# Patient Record
Sex: Female | Born: 1965 | Race: Black or African American | Hispanic: No | Marital: Single | Smoking: Never smoker
Health system: Southern US, Community
[De-identification: ages and names within clinical notes are randomized; demographics above are authoritative.]

## PROBLEM LIST (undated history)

## (undated) DIAGNOSIS — R7303 Prediabetes: Secondary | ICD-10-CM

## (undated) DIAGNOSIS — I10 Essential (primary) hypertension: Secondary | ICD-10-CM

## (undated) DIAGNOSIS — E119 Type 2 diabetes mellitus without complications: Secondary | ICD-10-CM

## (undated) HISTORY — PX: TUBAL LIGATION: SHX77

---

## 2007-04-18 ENCOUNTER — Ambulatory Visit: Payer: Self-pay

## 2014-09-03 ENCOUNTER — Ambulatory Visit
Admit: 2014-09-03 | Disposition: A | Discharge status: Home / Self Care | Payer: Self-pay | Attending: Urgent Care | Admitting: Urgent Care

## 2015-09-07 ENCOUNTER — Encounter: Payer: Self-pay | Admitting: *Deleted

## 2015-09-07 ENCOUNTER — Ambulatory Visit: Payer: Self-pay | Attending: Oncology | Admitting: *Deleted

## 2015-09-07 ENCOUNTER — Ambulatory Visit
Admission: RE | Admit: 2015-09-07 | Discharge: 2015-09-07 | Disposition: A | Discharge status: Home / Self Care | Payer: Self-pay | Source: Ambulatory Visit | Attending: Oncology | Admitting: Oncology

## 2015-09-07 ENCOUNTER — Other Ambulatory Visit: Payer: Self-pay | Admitting: *Deleted

## 2015-09-07 VITALS — BP 158/103 | HR 87 | Temp 98.2°F | Ht 66.93 in | Wt 352.1 lb

## 2015-09-07 DIAGNOSIS — Z0000 Encounter for general adult medical examination without abnormal findings: Secondary | ICD-10-CM

## 2015-09-07 NOTE — Progress Notes (Signed)
Subjective:     Patient ID: Candice Murillo, female   DOB: 02-04-1966, 50 y.o.   MRN: [id number]  HPI   Review of Systems     Objective:   Physical Exam  Pulmonary/Chest: Right breast exhibits inverted nipple. Right breast exhibits no mass, no nipple discharge, no skin change and no tenderness. Left breast exhibits inverted nipple. Left breast exhibits no mass, no nipple discharge, no skin change and no tenderness. Breasts are symmetrical.  Large pendulous breast.  Bilateral nipples inverted.  Patient states this is normal for her.       Assessment:     50 year old Black [facility]  Clinical breast exam unremarkable.  Taught self breast awareness.  Blood pressure elevated at 158/103 .  She is to recheck her blood pressure at [facility] or [facility], and if remains higher than 140/90 she is to follow-up with her primary care provider.  Hand out on hypertention given to patient.     Plan:     Joellyn Quailshristy Burton scheduled patient to be [facility] at 2:00 to evaluate the patients elevated BP.  She is agreeable.  Screening mammogram ordered.  Will follow-up per protocol.

## 2015-09-07 NOTE — Patient Instructions (Signed)
Patient has been screened for eligibility.  She does not have any insurance, Medicare or Medicaid.  She also meets financial eligibility.  Hand-out given on the Affordable Care Act.  Hypertension Hypertension, commonly called high blood pressure, is when the force of blood pumping through your arteries is too strong. Your arteries are the blood vessels that carry blood from your heart throughout your body. A blood pressure reading consists of a higher number over a lower number, such as 110/72. The higher number (systolic) is the pressure inside your arteries when your heart pumps. The lower number (diastolic) is the pressure inside your arteries when your heart relaxes. Ideally you want your blood pressure below 120/80. Hypertension forces your heart to work harder to pump blood. Your arteries may become narrow or stiff. Having untreated or uncontrolled hypertension can cause heart attack, stroke, kidney disease, and other problems. RISK FACTORS Some risk factors for high blood pressure are controllable. Others are not.  Risk factors you cannot control include:   Race. You may be at higher risk if you are African American.  Age. Risk increases with age.  Gender. Men are at higher risk than women before age 49 years. After age 49, women are at higher risk than men. Risk factors you can control include:  Not getting enough exercise or physical activity.  Being overweight.  Getting too much fat, sugar, calories, or salt in your diet.  Drinking too much alcohol. SIGNS AND SYMPTOMS Hypertension does not usually cause signs or symptoms. Extremely high blood pressure (hypertensive crisis) may cause headache, anxiety, shortness of breath, and nosebleed. DIAGNOSIS To check if you have hypertension, your health care provider will measure your blood pressure while you are seated, with your arm held at the level of your heart. It should be measured at least twice using the same arm. Certain conditions  can cause a difference in blood pressure between your right and left arms. A blood pressure reading that is higher than normal on one occasion does not mean that you need treatment. If it is not clear whether you have high blood pressure, you may be asked to return on a different day to have your blood pressure checked again. Or, you may be asked to monitor your blood pressure at home for 1 or more weeks. TREATMENT Treating high blood pressure includes making lifestyle changes and possibly taking medicine. Living a healthy lifestyle can help lower high blood pressure. You may need to change some of your habits. Lifestyle changes may include:  Following the DASH diet. This diet is high in fruits, vegetables, and whole grains. It is low in salt, red meat, and added sugars.  Keep your sodium intake below 2,300 mg per day.  Getting at least 30-45 minutes of aerobic exercise at least 4 times per week.  Losing weight if necessary.  Not smoking.  Limiting alcoholic beverages.  Learning ways to reduce stress. Your health care provider may prescribe medicine if lifestyle changes are not enough to get your blood pressure under control, and if one of the following is true:  You are 818-49 years of age and your systolic blood pressure is above 140.  You are 49 years of age or older, and your systolic blood pressure is above 150.  Your diastolic blood pressure is above 90.  You have diabetes, and your systolic blood pressure is over 140 or your diastolic blood pressure is over 90.  You have kidney disease and your blood pressure is above 140/90.  You have heart disease and your blood pressure is above 140/90. Your personal target blood pressure may vary depending on your medical conditions, your age, and other factors. HOME CARE INSTRUCTIONS  Have your blood pressure rechecked as directed by your health care provider.   Take medicines only as directed by your health care provider. Follow the  directions carefully. Blood pressure medicines must be taken as prescribed. The medicine does not work as well when you skip doses. Skipping doses also puts you at risk for problems.  Do not smoke.   Monitor your blood pressure at home as directed by your health care provider. SEEK MEDICAL CARE IF:   You think you are having a reaction to medicines taken.  You have recurrent headaches or feel dizzy.  You have swelling in your ankles.  You have trouble with your vision. SEEK IMMEDIATE MEDICAL CARE IF:  You develop a severe headache or confusion.  You have unusual weakness, numbness, or feel faint.  You have severe chest or abdominal pain.  You vomit repeatedly.  You have trouble breathing. MAKE SURE YOU:   Understand these instructions.  Will watch your condition.  Will get help right away if you are not doing well or get worse.   This information is not intended to replace advice given to you by your health care provider. Make sure you discuss any questions you have with your health care provider.   Document Released: 05/02/2005 Document Revised: 09/16/2014 Document Reviewed: 02/22/2013 Elsevier Interactive Patient Education [employer].

## 2015-09-09 ENCOUNTER — Encounter: Payer: Self-pay | Admitting: *Deleted

## 2015-09-09 NOTE — Progress Notes (Signed)
Letter mailed from the [facility] to inform patient of her normal mammogram results.  Patient is to follow-up with annual screening in one year.  HSIS to Christy. 

## 2016-11-09 ENCOUNTER — Ambulatory Visit: Payer: Self-pay | Attending: Oncology

## 2016-11-09 ENCOUNTER — Ambulatory Visit
Admission: RE | Admit: 2016-11-09 | Discharge: 2016-11-09 | Disposition: A | Discharge status: Home / Self Care | Payer: Self-pay | Source: Ambulatory Visit | Attending: Oncology | Admitting: Oncology

## 2016-11-09 VITALS — BP 137/82 | HR 80 | Temp 98.4°F | Resp 18 | Ht 66.0 in | Wt 351.0 lb

## 2016-11-09 DIAGNOSIS — Z0000 Encounter for general adult medical examination without abnormal findings: Secondary | ICD-10-CM

## 2016-11-09 NOTE — Progress Notes (Signed)
Subjective:     Patient ID: Candice Murillo, female   DOB: 08/17/1965, 51 y.o.   MRN: [id number]  HPI   Review of Systems     Objective:   Physical Exam  Pulmonary/Chest: Right breast exhibits no inverted nipple, no mass, no nipple discharge, no skin change and no tenderness. Left breast exhibits no inverted nipple, no mass, no nipple discharge, no skin change and no tenderness. Breasts are symmetrical.  Tattoos left upper chest; large pendulous breasts  Genitourinary:    No labial fusion. There is no rash, tenderness, lesion or injury on the right labia. There is no rash, tenderness, lesion or injury on the left labia. Uterus is not deviated, not enlarged, not fixed and not tender. Cervix exhibits no motion tenderness, no discharge and no friability. Right adnexum displays no mass, no tenderness and no fullness. Left adnexum displays no mass, no tenderness and no fullness. No erythema, tenderness or bleeding in the vagina. No foreign body in the vagina. No signs of injury around the vagina. Vaginal discharge found.  Genitourinary Comments: Piercing; thin white non-odorous vaginal discharge       Assessment:     51 year old patient [facility]. Patient screened, and meets [facility] eligibility.  Patient does not have insurance, Medicare or Medicaid.  Handout given on Affordable Care Act. Instructed patient on breast self-exam using teach back method.  CBE unremarkable.  No mass or lump palpated.  Patient had a negative/negative pap 09/07/14 that had trichomonas vaginalis results. These results were not treated.  Discussed with Dr. Johnnette Murillo.  Per his recommendation, pap repeated.  Will follow-up with Dr. Johnnette Murillo when results are received.    Plan:     Sent for bilateral screening mammogram.  Specimen collected for pap.

## 2016-11-14 LAB — PAP LB AND HPV HIGH-RISK
HPV, high-risk: NEGATIVE
PAP SMEAR COMMENT: 0

## 2016-11-15 NOTE — Progress Notes (Signed)
Patient given Birads 1 mammogram results, and negative/negative pap results.  Pap results showed Trichomonas.  Phoned in Metronidazole 2 grams or 4 tablets by mouth [facility] on [address] [place].  HSIS not complete.

## 2016-11-17 NOTE — Progress Notes (Signed)
Copy to HSIS. 

## 2016-11-22 NOTE — Progress Notes (Signed)
HSIS complete 

## 2018-01-01 ENCOUNTER — Ambulatory Visit: Payer: Self-pay

## 2018-02-05 ENCOUNTER — Ambulatory Visit: Payer: Self-pay | Attending: Oncology

## 2018-05-28 ENCOUNTER — Encounter: Payer: Self-pay | Admitting: *Deleted

## 2018-05-28 ENCOUNTER — Ambulatory Visit: Payer: Self-pay | Attending: Oncology | Admitting: *Deleted

## 2018-05-28 ENCOUNTER — Encounter (INDEPENDENT_AMBULATORY_CARE_PROVIDER_SITE_OTHER): Payer: Self-pay

## 2018-05-28 ENCOUNTER — Ambulatory Visit
Admission: RE | Admit: 2018-05-28 | Discharge: 2018-05-28 | Disposition: A | Discharge status: Home / Self Care | Payer: Self-pay | Source: Ambulatory Visit | Attending: Oncology | Admitting: Oncology

## 2018-05-28 VITALS — BP 150/88 | HR 66 | Temp 96.1°F | Ht 67.25 in | Wt 340.0 lb

## 2018-05-28 DIAGNOSIS — Z0000 Encounter for general adult medical examination without abnormal findings: Secondary | ICD-10-CM | POA: Insufficient documentation

## 2018-05-28 NOTE — Patient Instructions (Signed)
Gave patient hand-out, Women Staying Healthy, Active and Well from [facility], with education on breast health, pap smears, heart and colon health. 

## 2018-05-28 NOTE — Progress Notes (Signed)
  Subjective:     Patient ID: Candice Murillo, female   DOB: 07-Aug-1965, 53 y.o.   MRN: [id number]  HPI   Review of Systems     Objective:   Physical Exam Chest:     Breasts:        Right: Inverted nipple present. No swelling, bleeding, mass, nipple discharge, skin change or tenderness.        Left: Inverted nipple present. No swelling, bleeding, mass, nipple discharge, skin change or tenderness.          Assessment:     53 year old Black female returns to [facility] for annual screening.  Clinical breast exam with bilateral inverted nipples.  Patient states this is normal for her.  Taught self breast awareness. Last pap per our records was on 11/09/16 and was negative / negative.  Patient states she [facility] another [facility] last year [facility] and it was also normal.  Patient has been screened for eligibility.  She does not have any insurance, Medicare or Medicaid.  She also meets financial eligibility.  Hand-out given on the Affordable Care Act.  Risk Assessment    Risk Scores      05/28/2018   Last edited by: Alta Corning, CMA   5-year risk: 0.9 %   Lifetime risk: 6.6 %            Plan:     Screening mammogram ordered.  Next pap due in 2024.  Will follow-up per [facility] protocol.

## 2018-05-29 ENCOUNTER — Encounter: Payer: Self-pay | Admitting: *Deleted

## 2018-05-29 NOTE — Progress Notes (Signed)
Letter mailed from the [facility] to inform patient of her normal mammogram results.  Patient is to follow-up with annual screening in one year.  HSIS to Christy. 

## 2019-07-23 ENCOUNTER — Other Ambulatory Visit: Payer: Self-pay

## 2019-07-23 ENCOUNTER — Ambulatory Visit
Admission: RE | Admit: 2019-07-23 | Discharge: 2019-07-23 | Disposition: A | Discharge status: Home / Self Care | Payer: Self-pay | Source: Ambulatory Visit | Attending: Oncology | Admitting: Oncology

## 2019-07-23 ENCOUNTER — Ambulatory Visit: Payer: Self-pay | Attending: Oncology

## 2019-07-23 VITALS — BP 120/81 | HR 59 | Temp 96.2°F | Ht 66.5 in | Wt 334.6 lb

## 2019-07-23 DIAGNOSIS — N63 Unspecified lump in unspecified breast: Secondary | ICD-10-CM

## 2019-07-23 DIAGNOSIS — Z0000 Encounter for general adult medical examination without abnormal findings: Secondary | ICD-10-CM

## 2019-07-24 NOTE — Progress Notes (Signed)
  Subjective:     Patient ID: Candice Murillo, female   DOB: June 02, 1965, 54 y.o.   MRN: [id number]  HPI   Review of Systems     Objective:   Physical Exam Chest:     Breasts:        Right: No swelling, bleeding, inverted nipple, mass, nipple discharge, skin change or tenderness.        Left: No swelling, bleeding, inverted nipple, mass, nipple discharge, skin change or tenderness.        Assessment:     54 year old patient returns for [facility] clinic visit.  Patient screened, and meets [facility] eligibility.  Patient does not have insurance, Medicare or Medicaid. Instructed patient on breast self awareness using teach back method.  Clinical breast exam unremarkable. No mass or lump palpated.  Risk Assessment    Risk Scores      07/23/2019 05/28/2018   Last edited by: Jim Like, RN [place], Freada Bergeron, CMA   5-year risk: 1 % 0.9 %   Lifetime risk: 6.5 % 6.6 %             Plan:     Sent for bilateral screening mammogram.

## 2019-08-06 ENCOUNTER — Ambulatory Visit
Admission: RE | Admit: 2019-08-06 | Discharge: 2019-08-06 | Disposition: A | Discharge status: Home / Self Care | Payer: Self-pay | Source: Ambulatory Visit | Attending: Oncology | Admitting: Oncology

## 2019-08-06 DIAGNOSIS — N63 Unspecified lump in unspecified breast: Secondary | ICD-10-CM

## 2019-08-09 NOTE — Progress Notes (Unsigned)
Radiologist discussed Biras 1 findings with patient.  She is to return in 1 year for annual screening.  Copy to HSIS

## 2020-02-04 ENCOUNTER — Ambulatory Visit
Admission: EM | Admit: 2020-02-04 | Discharge: 2020-02-04 | Disposition: A | Discharge status: Home / Self Care | Payer: Self-pay

## 2020-09-16 ENCOUNTER — Other Ambulatory Visit: Payer: Self-pay

## 2020-09-16 ENCOUNTER — Encounter: Payer: Self-pay | Admitting: *Deleted

## 2020-09-16 ENCOUNTER — Ambulatory Visit: Payer: Self-pay | Attending: Oncology | Admitting: *Deleted

## 2020-09-16 ENCOUNTER — Ambulatory Visit
Admission: RE | Admit: 2020-09-16 | Discharge: 2020-09-16 | Disposition: A | Discharge status: Home / Self Care | Payer: Self-pay | Source: Ambulatory Visit | Attending: Oncology | Admitting: Oncology

## 2020-09-16 VITALS — BP 114/55 | HR 69 | Temp 96.7°F | Ht 66.0 in | Wt 329.7 lb

## 2020-09-16 DIAGNOSIS — Z0000 Encounter for general adult medical examination without abnormal findings: Secondary | ICD-10-CM

## 2020-09-16 NOTE — Progress Notes (Signed)
  Subjective:     Patient ID: Candice Murillo, female   DOB: 24-Apr-1966, 55 y.o.   MRN: [id number]  HPI   [facility] Medical History Record - 09/16/20 1036      Breast History   Screening cycle New    Provider (CBE) [employer]    Initial Mammogram 09/16/20    Last Mammogram Annual    Last Mammogram Date 07/23/19    Provider (Mammogram)  Delford Field    Recent Breast Symptoms None      Breast Cancer History   Breast Cancer History No personal or family history    Comments/Details Paterna 2nd couson had breast cancer      Previous History of Breast Problems   Breast Surgery or Biopsy None    Breast Implants N/A    BSE Done Monthly      Gynecological/Obstetrical History   LMP --   November 13 2019   Is there any chance that the client could be pregnant?  No    Age at menarche 59    Age at menopause perimenopausal    PAP smear history Annually    Date of last PAP  11/09/16    Provider (PAP) [facility]    Age at first live birth 50    Breast fed children No    DES Exposure No    Cervical, Uterine or Ovarian cancer No    Family history of Cervial, Uterine or Ovarian cancer No    Hysterectomy No    Cervix removed No    Ovaries removed No    Laser/Cryosurgery No    Current method of birth control Other (see comments)   tubal ligation   Current method of Estrogen/Hormone replacement None    Smoking history None            Review of Systems     Objective:   Physical Exam Chest:  Breasts:     Right: No swelling, bleeding, inverted nipple, mass, nipple discharge, skin change, tenderness, axillary adenopathy or supraclavicular adenopathy.     Left: No swelling, bleeding, inverted nipple, mass, nipple discharge, skin change, tenderness, axillary adenopathy or supraclavicular adenopathy.    Lymphadenopathy:     Upper Body:     Right upper body: No supraclavicular or axillary adenopathy.     Left upper body: No supraclavicular or axillary adenopathy.        Assessment:      55 year old female returns to [facility] for annual screening.  Clinical breast exam unremarkable.  Taught self breast awareness.  Last pap on 11/09/16 was negative / negative.  Next pap will be due in 2023. Patient has been screened for eligibility.  She does not have any insurance, Medicare or Medicaid.  She also meets financial eligibility.   Risk Assessment   No risk assessment data for the current encounter  Risk Scores      07/23/2019   Last edited by: Jim Like, RN   5-year risk: 1 %   Lifetime risk: 6.5 %            Plan:     Screening mammogram ordered.  Will follow up per [facility] protocol.

## 2020-09-16 NOTE — Patient Instructions (Signed)
Gave patient hand-out, Women Staying Healthy, Active and Well from [facility], with education on breast health, pap smears, heart and colon health. 

## 2020-09-16 NOTE — Progress Notes (Signed)
Letter mailed from [facility] to inform patient of her normal mammogram results.  Patient is to follow-up with annual screening in one year. 

## 2021-02-23 ENCOUNTER — Other Ambulatory Visit: Payer: Self-pay | Admitting: Physician Assistant

## 2021-02-23 DIAGNOSIS — N950 Postmenopausal bleeding: Secondary | ICD-10-CM

## 2021-03-03 ENCOUNTER — Ambulatory Visit: Admission: RE | Admit: 2021-03-03 | Payer: BC Managed Care – PPO | Source: Ambulatory Visit

## 2021-03-26 ENCOUNTER — Other Ambulatory Visit: Payer: Self-pay | Admitting: Family Medicine

## 2021-03-26 DIAGNOSIS — N644 Mastodynia: Secondary | ICD-10-CM

## 2021-03-26 DIAGNOSIS — N6310 Unspecified lump in the right breast, unspecified quadrant: Secondary | ICD-10-CM

## 2021-03-26 DIAGNOSIS — Z1231 Encounter for screening mammogram for malignant neoplasm of breast: Secondary | ICD-10-CM

## 2021-03-30 ENCOUNTER — Other Ambulatory Visit: Payer: Self-pay | Admitting: Family Medicine

## 2021-03-30 DIAGNOSIS — N6310 Unspecified lump in the right breast, unspecified quadrant: Secondary | ICD-10-CM

## 2021-04-14 ENCOUNTER — Ambulatory Visit
Admission: RE | Admit: 2021-04-14 | Discharge: 2021-04-14 | Disposition: A | Discharge status: Home / Self Care | Payer: BC Managed Care – PPO | Source: Ambulatory Visit | Attending: Family Medicine | Admitting: Family Medicine

## 2021-04-14 ENCOUNTER — Other Ambulatory Visit: Payer: Self-pay

## 2021-04-14 DIAGNOSIS — N6310 Unspecified lump in the right breast, unspecified quadrant: Secondary | ICD-10-CM

## 2021-04-14 DIAGNOSIS — N644 Mastodynia: Secondary | ICD-10-CM | POA: Insufficient documentation

## 2021-09-14 ENCOUNTER — Other Ambulatory Visit: Payer: Self-pay | Admitting: Family Medicine

## 2021-09-14 ENCOUNTER — Ambulatory Visit
Admission: RE | Admit: 2021-09-14 | Discharge: 2021-09-14 | Disposition: A | Discharge status: Home / Self Care | Payer: BC Managed Care – PPO | Source: Ambulatory Visit | Attending: Family Medicine | Admitting: Family Medicine

## 2021-09-14 DIAGNOSIS — M7989 Other specified soft tissue disorders: Secondary | ICD-10-CM | POA: Diagnosis not present

## 2022-04-11 ENCOUNTER — Other Ambulatory Visit: Payer: Self-pay | Admitting: Family Medicine

## 2022-04-11 DIAGNOSIS — Z1231 Encounter for screening mammogram for malignant neoplasm of breast: Secondary | ICD-10-CM

## 2022-09-20 ENCOUNTER — Ambulatory Visit
Admission: RE | Admit: 2022-09-20 | Discharge: 2022-09-20 | Disposition: A | Discharge status: Home / Self Care | Payer: BC Managed Care – PPO | Source: Ambulatory Visit | Attending: Family Medicine | Admitting: Family Medicine

## 2022-09-20 DIAGNOSIS — Z1231 Encounter for screening mammogram for malignant neoplasm of breast: Secondary | ICD-10-CM | POA: Diagnosis not present

## 2022-11-23 ENCOUNTER — Encounter: Payer: Self-pay | Admitting: Gastroenterology

## 2022-11-24 ENCOUNTER — Encounter: Payer: Self-pay | Admitting: Gastroenterology

## 2022-12-01 ENCOUNTER — Encounter: Payer: Self-pay | Admitting: Gastroenterology

## 2022-12-01 ENCOUNTER — Other Ambulatory Visit: Payer: Self-pay

## 2022-12-01 ENCOUNTER — Ambulatory Visit: Payer: BC Managed Care – PPO | Admitting: Certified Registered"

## 2022-12-01 ENCOUNTER — Encounter
Admission: RE | Disposition: A | Discharge status: Home / Self Care | Payer: Self-pay | Source: Home / Self Care | Attending: Gastroenterology

## 2022-12-01 ENCOUNTER — Ambulatory Visit
Admission: RE | Admit: 2022-12-01 | Discharge: 2022-12-01 | Disposition: A | Discharge status: Home / Self Care | Payer: BC Managed Care – PPO | Attending: Gastroenterology | Admitting: Gastroenterology

## 2022-12-01 DIAGNOSIS — D122 Benign neoplasm of ascending colon: Secondary | ICD-10-CM | POA: Insufficient documentation

## 2022-12-01 DIAGNOSIS — Z1211 Encounter for screening for malignant neoplasm of colon: Secondary | ICD-10-CM | POA: Diagnosis not present

## 2022-12-01 DIAGNOSIS — K219 Gastro-esophageal reflux disease without esophagitis: Secondary | ICD-10-CM | POA: Insufficient documentation

## 2022-12-01 DIAGNOSIS — Z79899 Other long term (current) drug therapy: Secondary | ICD-10-CM | POA: Insufficient documentation

## 2022-12-01 DIAGNOSIS — Z8371 Family history of adenomatous and serrated polyps: Secondary | ICD-10-CM | POA: Insufficient documentation

## 2022-12-01 DIAGNOSIS — D123 Benign neoplasm of transverse colon: Secondary | ICD-10-CM | POA: Insufficient documentation

## 2022-12-01 DIAGNOSIS — Z6841 Body Mass Index (BMI) 40.0 and over, adult: Secondary | ICD-10-CM | POA: Diagnosis not present

## 2022-12-01 DIAGNOSIS — D500 Iron deficiency anemia secondary to blood loss (chronic): Secondary | ICD-10-CM | POA: Diagnosis not present

## 2022-12-01 DIAGNOSIS — I10 Essential (primary) hypertension: Secondary | ICD-10-CM | POA: Diagnosis not present

## 2022-12-01 DIAGNOSIS — D125 Benign neoplasm of sigmoid colon: Secondary | ICD-10-CM | POA: Diagnosis not present

## 2022-12-01 DIAGNOSIS — K449 Diaphragmatic hernia without obstruction or gangrene: Secondary | ICD-10-CM | POA: Insufficient documentation

## 2022-12-01 HISTORY — PX: BIOPSY: SHX5522

## 2022-12-01 HISTORY — DX: Morbid (severe) obesity due to excess calories: E66.01

## 2022-12-01 HISTORY — DX: Essential (primary) hypertension: I10

## 2022-12-01 HISTORY — DX: Prediabetes: R73.03

## 2022-12-01 HISTORY — PX: ESOPHAGOGASTRODUODENOSCOPY (EGD) WITH PROPOFOL: SHX5813

## 2022-12-01 HISTORY — PX: COLONOSCOPY WITH PROPOFOL: SHX5780

## 2022-12-01 HISTORY — DX: Type 2 diabetes mellitus without complications: E11.9

## 2022-12-01 HISTORY — PX: POLYPECTOMY: SHX5525

## 2022-12-01 SURGERY — COLONOSCOPY WITH PROPOFOL
Anesthesia: General

## 2022-12-01 MED ORDER — DEXMEDETOMIDINE HCL IN NACL 200 MCG/50ML IV SOLN
INTRAVENOUS | Status: DC | PRN
  Administered 2022-12-01 (×5): 4 ug via INTRAVENOUS

## 2022-12-01 MED ORDER — SODIUM CHLORIDE 0.9 % IV SOLN: INTRAVENOUS | Status: DC | PRN

## 2022-12-01 MED ORDER — SODIUM CHLORIDE 0.9 % IV SOLN: INTRAVENOUS | Status: DC

## 2022-12-01 MED ORDER — PROPOFOL 500 MG/50ML IV EMUL
INTRAVENOUS | Status: DC | PRN
  Administered 2022-12-01: 150 ug/kg/min via INTRAVENOUS
  Administered 2022-12-01: 30 mg via INTRAVENOUS

## 2022-12-01 MED ORDER — ONDANSETRON HCL 4 MG/2ML IJ SOLN
INTRAMUSCULAR | Status: DC | PRN
  Administered 2022-12-01: 4 mg via INTRAVENOUS

## 2022-12-01 MED ORDER — GLYCOPYRROLATE 0.2 MG/ML IJ SOLN
INTRAMUSCULAR | Status: DC | PRN
  Administered 2022-12-01: .2 mg via INTRAVENOUS

## 2022-12-01 MED ORDER — LIDOCAINE HCL (CARDIAC) PF 100 MG/5ML IV SOSY
PREFILLED_SYRINGE | INTRAVENOUS | Status: DC | PRN
  Administered 2022-12-01: 100 mg via INTRAVENOUS

## 2022-12-01 NOTE — H&P (Signed)
Pre-Procedure H&P   Patient ID: Candice Murillo is a 57 y.o. female.  Gastroenterology Provider: Jaynie Collins, DO  Referring Provider: Amedeo Kinsman, NP PCP: Cyndia Diver, PA-C  Date: 12/01/2022  HPI Candice Murillo is a 57 y.o. female who presents today for Esophagogastroduodenoscopy and Colonoscopy for Anemia, bloating, GERD.  Patient noted to have anemia with a hemoglobin 11.3 MCV 69 platelets 278,000 creatinine 1.2.  Celiac testing negative B12 288 iron saturation 20 ferritin 64 TIBC 338  Patient reports daily bowel movement without melena or hematochezia.  Reflux she experiences resolves with Tums.  She was taking ibuprofen 800 mg 4 times a week  No previous endoscopy  Notes increased abdominal bloating with beef and dairy products  Mother and sister with colon polyps   Past Medical History:  Diagnosis Date   Hypertension    Morbid obesity (HCC)    Pre-diabetes     Past Surgical History:  Procedure Laterality Date   TUBAL LIGATION      Family History Mother and sister with a history of colon polyps No h/o GI disease or malignancy  Review of Systems  Constitutional:  Negative for activity change, appetite change, chills, diaphoresis, fatigue, fever and unexpected weight change.  HENT:  Negative for trouble swallowing and voice change.   Respiratory:  Negative for shortness of breath and wheezing.   Cardiovascular:  Negative for chest pain, palpitations and leg swelling.  Gastrointestinal:  Negative for abdominal distention, abdominal pain, anal bleeding, blood in stool, constipation, diarrhea, nausea, rectal pain and vomiting.       + Bloating, GERD  Musculoskeletal:  Negative for arthralgias and myalgias.  Skin:  Negative for color change and pallor.  Neurological:  Negative for dizziness, syncope and weakness.  Psychiatric/Behavioral:  Negative for confusion.   All other systems reviewed and are negative.    Medications No  current facility-administered medications on file prior to encounter.   Current Outpatient Medications on File Prior to Encounter  Medication Sig Dispense Refill   acyclovir (ZOVIRAX) 200 MG capsule Take 200 mg by mouth 5 (five) times daily.     lisinopril-hydrochlorothiazide (ZESTORETIC) 20-25 MG tablet Take 1 tablet by mouth daily.     omeprazole (PRILOSEC) 10 MG capsule Take 20 mg by mouth daily.     buPROPion (WELLBUTRIN SR) 150 MG 12 hr tablet Take 150 mg by mouth 2 (two) times daily. (Patient not taking: Reported on 12/01/2022)      Pertinent medications related to GI and procedure were reviewed by me with the patient prior to the procedure   Current Facility-Administered Medications:    0.9 %  sodium chloride infusion, , Intravenous, Continuous, Jaynie Collins, DO, Last Rate: 20 mL/hr at 12/01/22 0940, New Bag at 12/01/22 0940  sodium chloride 20 mL/hr at 12/01/22 0940       No Known Allergies Allergies were reviewed by me prior to the procedure  Objective   Body mass index is 52.26 kg/m. Vitals:   12/01/22 0925  BP: 117/72  Pulse: 80  Resp: 20  Temp: (!) 96.4 F (35.8 C)  TempSrc: Temporal  SpO2: 99%  Weight: (!) 146.9 kg  Height: 5\' 6"  (1.676 m)     Physical Exam Vitals and nursing note reviewed.  Constitutional:      General: She is not in acute distress.    Appearance: Normal appearance. She is obese. She is not ill-appearing, toxic-appearing or diaphoretic.  HENT:     Head: Normocephalic and  atraumatic.     Nose: Nose normal.     Mouth/Throat:     Mouth: Mucous membranes are moist.     Pharynx: Oropharynx is clear.  Eyes:     General: No scleral icterus.    Extraocular Movements: Extraocular movements intact.  Cardiovascular:     Rate and Rhythm: Normal rate and regular rhythm.     Heart sounds: Normal heart sounds. No murmur heard.    No friction rub. No gallop.  Pulmonary:     Effort: Pulmonary effort is normal. No respiratory distress.      Breath sounds: Normal breath sounds. No wheezing, rhonchi or rales.  Abdominal:     General: Bowel sounds are normal. There is no distension.     Palpations: Abdomen is soft.     Tenderness: There is no abdominal tenderness. There is no guarding or rebound.  Musculoskeletal:     Cervical back: Neck supple.     Right lower leg: No edema.     Left lower leg: No edema.  Skin:    General: Skin is warm and dry.     Coloration: Skin is not jaundiced or pale.  Neurological:     General: No focal deficit present.     Mental Status: She is alert and oriented to person, place, and time. Mental status is at baseline.  Psychiatric:        Mood and Affect: Mood normal.        Behavior: Behavior normal.        Thought Content: Thought content normal.        Judgment: Judgment normal.      Assessment:  Candice Murillo is a 57 y.o. female  who presents today for Esophagogastroduodenoscopy and Colonoscopy for Anemia, bloating, GERD .  Plan:  Esophagogastroduodenoscopy and Colonoscopy with possible intervention today  Esophagogastroduodenoscopy and Colonoscopy with possible biopsy, control of bleeding, polypectomy, and interventions as necessary has been discussed with the patient/patient representative. Informed consent was obtained from the patient/patient representative after explaining the indication, nature, and risks of the procedure including but not limited to death, bleeding, perforation, missed neoplasm/lesions, cardiorespiratory compromise, and reaction to medications. Opportunity for questions was given and appropriate answers were provided. Patient/patient representative has verbalized understanding is amenable to undergoing the procedure.   Jaynie Collins, DO  [facility]  Portions of the record may have been created with voice recognition software. Occasional wrong-word or 'sound-a-like' substitutions may have occurred due to the inherent  limitations of voice recognition software.  Read the chart carefully and recognize, using context, where substitutions may have occurred.

## 2022-12-01 NOTE — Transfer of Care (Signed)
Immediate Anesthesia Transfer of Care Note  Patient: Candice Murillo  Procedure(s) Performed: COLONOSCOPY WITH PROPOFOL ESOPHAGOGASTRODUODENOSCOPY (EGD) WITH PROPOFOL BIOPSY POLYPECTOMY  Patient Location: PACU  Anesthesia Type:MAC  Level of Consciousness: awake  Airway & Oxygen Therapy: Patient Spontanous Breathing  Post-op Assessment: Report given to RN and Post -op Vital signs reviewed and stable  Post vital signs: Reviewed  Last Vitals:  Vitals Value Taken Time  BP 96/59 12/01/22 1111  Temp 35.9 C 12/01/22 1110  Pulse 107 12/01/22 1111  Resp 14 12/01/22 1111  SpO2 96 % 12/01/22 1111  Vitals shown include unfiled device data.  Last Pain:  Vitals:   12/01/22 1110  TempSrc: Temporal  PainSc: 0-No pain         Complications: No notable events documented.

## 2022-12-01 NOTE — Interval H&P Note (Signed)
History and Physical Interval Note: Preprocedure H&P from 12/01/22  was reviewed and there was no interval change after seeing and examining the patient.  Written consent was obtained from the patient after discussion of risks, benefits, and alternatives. Patient has consented to proceed with Esophagogastroduodenoscopy and Colonoscopy with possible intervention   12/01/2022 10:26 AM  Candice Murillo  has presented today for surgery, with the diagnosis of D64.9 (ICD-10-CM) - Anemia, unspecified type.  The various methods of treatment have been discussed with the patient and family. After consideration of risks, benefits and other options for treatment, the patient has consented to  Procedure(s): COLONOSCOPY WITH PROPOFOL (N/A) ESOPHAGOGASTRODUODENOSCOPY (EGD) WITH PROPOFOL (N/A) as a surgical intervention.  The patient's history has been reviewed, patient examined, no change in status, stable for surgery.  I have reviewed the patient's chart and labs.  Questions were answered to the patient's satisfaction.     Jaynie Collins

## 2022-12-01 NOTE — Anesthesia Postprocedure Evaluation (Signed)
Anesthesia Post Note  Patient: Candice Murillo  Procedure(s) Performed: COLONOSCOPY WITH PROPOFOL ESOPHAGOGASTRODUODENOSCOPY (EGD) WITH PROPOFOL BIOPSY POLYPECTOMY  Patient location during evaluation: Endoscopy Anesthesia Type: General Level of consciousness: awake and alert Pain management: pain level controlled Vital Signs Assessment: post-procedure vital signs reviewed and stable Respiratory status: spontaneous breathing, nonlabored ventilation, respiratory function stable and patient connected to nasal cannula oxygen Cardiovascular status: blood pressure returned to baseline and stable Postop Assessment: no apparent nausea or vomiting Anesthetic complications: no   No notable events documented.   Last Vitals:  Vitals:   12/01/22 1120 12/01/22 1130  BP: 109/62 114/83  Pulse:    Resp:  (!) 21  Temp:    SpO2: 96%     Last Pain:  Vitals:   12/01/22 1110  TempSrc: Temporal  PainSc: 0-No pain                 Louie Boston

## 2022-12-01 NOTE — Op Note (Signed)
[facility] Gastroenterology Patient Name: Candice Murillo Procedure Date: 12/01/2022 10:28 AM MRN: [id number] Account #: [id number] Date of Birth: 1965-08-28 Admit Type: Outpatient Age: 57 Room: [facility] ENDO ROOM 1 Gender: Female Note Status: Finalized Instrument Name: Upper Endoscope [id number] Procedure:             Upper GI endoscopy Indications:           Iron deficiency anemia secondary to chronic blood loss Providers:             Jaynie Collins DO, DO Referring MD:          Cyndia Diver (Referring MD) Medicines:             Monitored Anesthesia Care Complications:         No immediate complications. Estimated blood loss:                         Minimal. Procedure:             Pre-Anesthesia Assessment:                        - Prior to the procedure, a History and Physical was                         performed, and patient medications and allergies were                         reviewed. The patient is competent. The risks and                         benefits of the procedure and the sedation options and                         risks were discussed with the patient. All questions                         were answered and informed consent was obtained.                         Patient identification and proposed procedure were                         verified by the physician, the nurse, the anesthetist                         and the technician in the endoscopy suite. Mental                         Status Examination: alert and oriented. Airway                         Examination: normal oropharyngeal airway and neck                         mobility. Respiratory Examination: clear to                         auscultation. CV Examination: RRR, no murmurs, no S3  or S4. Prophylactic Antibiotics: The patient does not                         require prophylactic antibiotics. Prior                         Anticoagulants: The patient has  taken no anticoagulant                         or antiplatelet agents. ASA Grade Assessment: III - A                         patient with severe systemic disease. After reviewing                         the risks and benefits, the patient was deemed in                         satisfactory condition to undergo the procedure. The                         anesthesia plan was to use monitored anesthesia care                         (MAC). Immediately prior to administration of                         medications, the patient was re-assessed for adequacy                         to receive sedatives. The heart rate, respiratory                         rate, oxygen saturations, blood pressure, adequacy of                         pulmonary ventilation, and response to care were                         monitored throughout the procedure. The physical                         status of the patient was re-assessed after the                         procedure.                        After obtaining informed consent, the endoscope was                         passed under direct vision. Throughout the procedure,                         the patient's blood pressure, pulse, and oxygen                         saturations were monitored continuously. The Endoscope  was introduced through the mouth, and advanced to the                         second part of duodenum. The upper GI endoscopy was                         accomplished without difficulty. The patient tolerated                         the procedure well. Findings:      The duodenal bulb, first portion of the duodenum and second portion of       the duodenum were normal. Biopsies were taken with a cold forceps for       histology. Estimated blood loss was minimal.      A small hiatal hernia was present. Estimated blood loss: none.      The entire examined stomach was normal. Biopsies were taken with a cold       forceps for  Helicobacter pylori testing. Estimated blood loss was       minimal.      The Z-line was regular. Estimated blood loss: none.      Esophagogastric landmarks were identified: the gastroesophageal junction       was found at 40 cm from the incisors.      The examined esophagus was normal. Estimated blood loss: none. Impression:            - Normal duodenal bulb, first portion of the duodenum                         and second portion of the duodenum. Biopsied.                        - Small hiatal hernia.                        - Normal stomach. Biopsied.                        - Z-line regular.                        - Esophagogastric landmarks identified.                        - Normal esophagus. Recommendation:        - Patient has a contact number available for                         emergencies. The signs and symptoms of potential                         delayed complications were discussed with the patient.                         Return to normal activities tomorrow. Written                         discharge instructions were provided to the patient.                        -  Discharge patient to home.                        - Resume previous diet.                        - Continue present medications.                        - Await pathology results.                        - Return to GI clinic as previously scheduled.                        - proceed with colonoscopy                        - The findings and recommendations were discussed with                         the patient. Procedure Code(s):     --- Professional ---                        [id number], Esophagogastroduodenoscopy, flexible,                         transoral; with biopsy, single or multiple Diagnosis Code(s):     --- Professional ---                        K44.9, Diaphragmatic hernia without obstruction or                         gangrene                        D50.0, Iron deficiency anemia secondary to blood  loss                         (chronic) CPT copyright 2022 American [employer] Association. All rights reserved. The codes documented in this report are preliminary and upon coder review may  be revised to meet current compliance requirements. Attending Participation:      I personally performed the entire procedure. Elfredia Nevins, DO Jaynie Collins DO, DO 12/01/2022 10:46:34 AM This report has been signed electronically. Number of Addenda: 0 Note Initiated On: 12/01/2022 10:28 AM Estimated Blood Loss:  Estimated blood loss was minimal.      [facility]

## 2022-12-01 NOTE — Anesthesia Preprocedure Evaluation (Signed)
Anesthesia Evaluation  Patient identified by MRN, date of birth, ID band Patient awake    Reviewed: Allergy & Precautions, NPO status , Patient's Chart, lab work & pertinent test results  History of Anesthesia Complications Negative for: history of anesthetic complications  Airway Mallampati: III  TM Distance: >3 FB Neck ROM: full    Dental no notable dental hx.    Pulmonary neg pulmonary ROS   Pulmonary exam normal        Cardiovascular hypertension, On Medications negative cardio ROS Normal cardiovascular exam     Neuro/Psych negative neurological ROS  negative psych ROS   GI/Hepatic negative GI ROS, Neg liver ROS,,,  Endo/Other    Morbid obesity  Renal/GU negative Renal ROS  negative genitourinary   Musculoskeletal   Abdominal   Peds  Hematology negative hematology ROS (+)   Anesthesia Other Findings Past Medical History: No date: Hypertension No date: Morbid obesity (HCC) No date: Pre-diabetes  Past Surgical History: No date: TUBAL LIGATION  BMI    Body Mass Index: 52.26 kg/m      Reproductive/Obstetrics negative OB ROS                             Anesthesia Physical Anesthesia Plan  ASA: 3  Anesthesia Plan: General   Post-op Pain Management: Minimal or no pain anticipated   Induction: Intravenous  PONV Risk Score and Plan: Propofol infusion and TIVA  Airway Management Planned: Natural Airway and Nasal Cannula  Additional Equipment:   Intra-op Plan:   Post-operative Plan:   Informed Consent: I have reviewed the patients History and Physical, chart, labs and discussed the procedure including the risks, benefits and alternatives for the proposed anesthesia with the patient or authorized representative who has indicated his/her understanding and acceptance.     Dental Advisory Given  Plan Discussed with: Anesthesiologist, CRNA and Surgeon  Anesthesia Plan  Comments: (Patient consented for risks of anesthesia including but not limited to:  - adverse reactions to medications - risk of airway placement if required - damage to eyes, teeth, lips or other oral mucosa - nerve damage due to positioning  - sore throat or hoarseness - Damage to heart, brain, nerves, lungs, other parts of body or loss of life  Patient voiced understanding.)       Anesthesia Quick Evaluation

## 2022-12-01 NOTE — Op Note (Signed)
[facility] Gastroenterology Patient Name: Candice Murillo Procedure Date: 12/01/2022 10:28 AM MRN: [id number] Account #: [id number] Date of Birth: [birth year] Admit Type: Outpatient Age: 57 Room: [facility] ENDO ROOM 1 Gender: Female Note Status: Supervisor Override Instrument Name: Prentice Docker [id number] Procedure:             Colonoscopy Indications:           Colon cancer screening in patient at increased risk:                         Family history of colon polyps in multiple 1st-degree                         relatives, Iron deficiency anemia Providers:             Trenda Moots, DO Referring MD:          Cyndia Diver (Referring MD) Medicines:             Monitored Anesthesia Care Complications:         No immediate complications. Estimated blood loss:                         Minimal. Procedure:             Pre-Anesthesia Assessment:                        - Prior to the procedure, a History and Physical was                         performed, and patient medications and allergies were                         reviewed. The patient is competent. The risks and                         benefits of the procedure and the sedation options and                         risks were discussed with the patient. All questions                         were answered and informed consent was obtained.                         Patient identification and proposed procedure were                         verified by the physician, the nurse, the anesthetist                         and the technician in the endoscopy suite. Mental                         Status Examination: alert and oriented. Airway                         Examination: normal oropharyngeal airway and neck  mobility. Respiratory Examination: clear to                         auscultation. CV Examination: RRR, no murmurs, no S3                         or S4. Prophylactic Antibiotics: The  patient does not                         require prophylactic antibiotics. Prior                         Anticoagulants: The patient has taken no anticoagulant                         or antiplatelet agents. ASA Grade Assessment: III - A                         patient with severe systemic disease. After reviewing                         the risks and benefits, the patient was deemed in                         satisfactory condition to undergo the procedure. The                         anesthesia plan was to use monitored anesthesia care                         (MAC). Immediately prior to administration of                         medications, the patient was re-assessed for adequacy                         to receive sedatives. The heart rate, respiratory                         rate, oxygen saturations, blood pressure, adequacy of                         pulmonary ventilation, and response to care were                         monitored throughout the procedure. The physical                         status of the patient was re-assessed after the                         procedure.                        After obtaining informed consent, the colonoscope was                         passed under direct vision. Throughout the procedure,  the patient's blood pressure, pulse, and oxygen                         saturations were monitored continuously. The                         Colonoscope was introduced through the anus and                         advanced to the the terminal ileum, with                         identification of the appendiceal orifice and IC                         valve. The colonoscopy was performed without                         difficulty. The patient tolerated the procedure well.                         The quality of the bowel preparation was evaluated                         using the BBPS [facility] Bowel Preparation Scale) with                          scores of: Right Colon = 3, Transverse Colon = 3 and                         Left Colon = 3 (entire mucosa seen well with no                         residual staining, small fragments of stool or opaque                         liquid). The total BBPS score equals 9. The terminal                         ileum, ileocecal valve, appendiceal orifice, and                         rectum were photographed. Findings:      The perianal and digital rectal examinations were normal. Pertinent       negatives include normal sphincter tone.      The terminal ileum appeared normal. Estimated blood loss: none.      Retroflexion in the right colon was performed.      Five sessile polyps were found in the sigmoid colon (2), transverse       colon (2) and ascending colon (1). The polyps were 1 to 2 mm in size.       These polyps were removed with a jumbo cold forceps. Resection and       retrieval were complete. Estimated blood loss was minimal.      The exam was otherwise without abnormality on direct and retroflexion       views. Impression:            -  The examined portion of the ileum was normal.                        - Five 1 to 2 mm polyps in the sigmoid colon, in the                         transverse colon and in the ascending colon, removed                         with a jumbo cold forceps. Resected and retrieved.                        - The examination was otherwise normal on direct and                         retroflexion views. Recommendation:        - Patient has a contact number available for                         emergencies. The signs and symptoms of potential                         delayed complications were discussed with the patient.                         Return to normal activities tomorrow. Written                         discharge instructions were provided to the patient.                        - Discharge patient to home.                        - Resume previous diet.                         - Continue present medications.                        - Await pathology results.                        - Repeat colonoscopy for surveillance based on                         pathology results.                        - Return to GI office as previously scheduled.                        - The findings and recommendations were discussed with                         the patient. Procedure Code(s):     --- Professional ---                        [phone], Colonoscopy, flexible; with biopsy, single or  multiple Diagnosis Code(s):     --- Professional ---                        Z83.71, Family history of colonic polyps                        D12.5, Benign neoplasm of sigmoid colon                        D12.3, Benign neoplasm of transverse colon (hepatic                         flexure or splenic flexure)                        D12.2, Benign neoplasm of ascending colon CPT copyright 2022 American Medical Association. All rights reserved. The codes documented in this report are preliminary and upon coder review may  be revised to meet current compliance requirements. Attending Participation:      I personally performed the entire procedure. Elfredia Nevins, DO Jaynie Collins DO, DO 12/01/2022 11:10:27 AM This report has been signed electronically. Number of Addenda: 0 Note Initiated On: 12/01/2022 10:28 AM Scope Withdrawal Time: 0 hours 13 minutes 37 seconds  Total Procedure Duration: 0 hours 17 minutes 3 seconds  Estimated Blood Loss:  Estimated blood loss was minimal.      [facility]

## 2022-12-12 ENCOUNTER — Encounter: Payer: Self-pay | Admitting: Oncology

## 2022-12-12 ENCOUNTER — Inpatient Hospital Stay: Payer: BC Managed Care – PPO

## 2022-12-12 ENCOUNTER — Inpatient Hospital Stay: Payer: BC Managed Care – PPO | Attending: Oncology | Admitting: Oncology

## 2022-12-12 ENCOUNTER — Other Ambulatory Visit: Payer: Self-pay

## 2022-12-12 VITALS — BP 138/72 | HR 61 | Temp 97.5°F | Resp 18 | Wt 334.2 lb

## 2022-12-12 DIAGNOSIS — D509 Iron deficiency anemia, unspecified: Secondary | ICD-10-CM

## 2022-12-12 DIAGNOSIS — Z79899 Other long term (current) drug therapy: Secondary | ICD-10-CM

## 2022-12-12 DIAGNOSIS — N1831 Chronic kidney disease, stage 3a: Secondary | ICD-10-CM

## 2022-12-12 DIAGNOSIS — N189 Chronic kidney disease, unspecified: Secondary | ICD-10-CM

## 2022-12-12 DIAGNOSIS — E538 Deficiency of other specified B group vitamins: Secondary | ICD-10-CM

## 2022-12-12 DIAGNOSIS — Z818 Family history of other mental and behavioral disorders: Secondary | ICD-10-CM | POA: Diagnosis not present

## 2022-12-12 DIAGNOSIS — Z8249 Family history of ischemic heart disease and other diseases of the circulatory system: Secondary | ICD-10-CM | POA: Diagnosis not present

## 2022-12-12 DIAGNOSIS — Z803 Family history of malignant neoplasm of breast: Secondary | ICD-10-CM

## 2022-12-12 DIAGNOSIS — Z832 Family history of diseases of the blood and blood-forming organs and certain disorders involving the immune mechanism: Secondary | ICD-10-CM

## 2022-12-12 DIAGNOSIS — Z79624 Long term (current) use of inhibitors of nucleotide synthesis: Secondary | ICD-10-CM

## 2022-12-12 DIAGNOSIS — Z5941 Food insecurity: Secondary | ICD-10-CM | POA: Diagnosis not present

## 2022-12-12 LAB — CBC WITH DIFFERENTIAL/PLATELET
Abs Immature Granulocytes: 0.04 10*3/uL (ref 0.00–0.07)
Basophils Absolute: 0 10*3/uL (ref 0.0–0.1)
Basophils Relative: 0 %
Eosinophils Absolute: 0.1 10*3/uL (ref 0.0–0.5)
Eosinophils Relative: 1 %
HCT: 36.5 % (ref 36.0–46.0)
Hemoglobin: 11.2 g/dL — ABNORMAL LOW (ref 12.0–15.0)
Immature Granulocytes: 1 %
Lymphocytes Relative: 35 %
Lymphs Abs: 2.7 10*3/uL (ref 0.7–4.0)
MCH: 20.6 pg — ABNORMAL LOW (ref 26.0–34.0)
MCHC: 30.7 g/dL (ref 30.0–36.0)
MCV: 67.2 fL — ABNORMAL LOW (ref 80.0–100.0)
Monocytes Absolute: 0.4 10*3/uL (ref 0.1–1.0)
Monocytes Relative: 5 %
Neutro Abs: 4.7 10*3/uL (ref 1.7–7.7)
Neutrophils Relative %: 58 %
Platelets: 241 10*3/uL (ref 150–400)
RBC: 5.43 MIL/uL — ABNORMAL HIGH (ref 3.87–5.11)
RDW: 15.8 % — ABNORMAL HIGH (ref 11.5–15.5)
Smear Review: ADEQUATE
WBC: 7.9 10*3/uL (ref 4.0–10.5)
nRBC: 0 % (ref 0.0–0.2)

## 2022-12-12 LAB — TECHNOLOGIST SMEAR REVIEW: Plt Morphology: ADEQUATE

## 2022-12-12 LAB — FERRITIN: Ferritin: 69 ng/mL (ref 11–307)

## 2022-12-12 LAB — IRON AND TIBC
Iron: 81 ug/dL (ref 28–170)
Saturation Ratios: 22 % (ref 10.4–31.8)
TIBC: 361 ug/dL (ref 250–450)
UIBC: 280 ug/dL

## 2022-12-12 LAB — FOLATE: Folate: 9.8 ng/mL (ref >5.9)

## 2022-12-12 LAB — VITAMIN B12: Vitamin B-12: 1207 pg/mL — ABNORMAL HIGH (ref 180–914)

## 2022-12-12 NOTE — Assessment & Plan Note (Addendum)
Encourage oral hydration and avoid nephrotoxins.   Check SPEP and light chain ratio.

## 2022-12-12 NOTE — Assessment & Plan Note (Signed)
Chronic microcytosis, previous normal iron panel.  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 Recommend to repeat CBC, check smear, iron TIBC ferritin, hemoglobinopathy evaluation

## 2022-12-12 NOTE — Progress Notes (Signed)
Hematology/Oncology Consult note [phone] [phone]      Patient Care Team: Cyndia Diver, PA-C as PCP - General (Family Medicine) Jim Like, RN as Registered Nurse Scarlett Presto, RN (Inactive) as Registered Nurse   REFERRING PROVIDER: Theadore Nan, NP  CHIEF COMPLAINTS/REASON FOR VISIT:  Anemia  ASSESSMENT & PLAN:  Microcytic anemia Chronic microcytosis, previous normal iron panel.  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 Recommend to repeat CBC, check smear, iron TIBC ferritin, hemoglobinopathy evaluation  B12 deficiency Check vitamin B12 and folate  CKD (chronic kidney disease) Encourage oral hydration and avoid nephrotoxins.   Check SPEP and light chain ratio.  Orders Placed This Encounter  Procedures   Vitamin B12    Standing Status:   Future    Number of Occurrences:   1    Standing Expiration Date:   12/12/2023   Iron and TIBC    Standing Status:   Future    Number of Occurrences:   1    Standing Expiration Date:   12/12/2023   Ferritin    Standing Status:   Future    Number of Occurrences:   1    Standing Expiration Date:   06/14/2023   CBC with Differential/Platelet    Standing Status:   Future    Number of Occurrences:   1    Standing Expiration Date:   12/12/2023   Multiple Myeloma Panel (SPEP&IFE w/QIG)    Standing Status:   Future    Number of Occurrences:   1    Standing Expiration Date:   12/12/2023   Kappa/lambda light chains    Standing Status:   Future    Number of Occurrences:   1    Standing Expiration Date:   12/12/2023   Technologist smear review    Standing Status:   Future    Number of Occurrences:   1    Standing Expiration Date:   12/12/2023    Order Specific Question:   Clinical information:    Answer:   anemia   Hgb Fractionation Cascade    Standing Status:   Future    Number of Occurrences:   1    Standing Expiration Date:   12/12/2023   Folate    Standing Status:   Future     Number of Occurrences:   1    Standing Expiration Date:   12/12/2023   Follow-up in a few weeks to review results. All questions were answered. The patient knows to call the clinic with any problems, questions or concerns.  Rickard Patience, MD, PhD [facility] Hematology Oncology 12/12/2022     HISTORY OF PRESENTING ILLNESS:  Candice Murillo is a  57 y.o.  female with PMH listed below who was referred to me for anemia Reviewed patient's recent labs that was done.  Reviewed patient's previous labs ordered by primary care physician's office, anemia is chronic onset , duration is since at least November 2023.  She has no prior labs to compare with.  09/06/2022 hemoglobin 11.3.MCV 69, ferritin 64, normal iron saturation.  She denies recent chest pain on exertion, shortness of breath on minimal exertion, pre-syncopal episodes, or palpitations She had not noticed any recent bleeding such as epistaxis, hematuria or hematochezia.  She denies any pica and eats a variety of diet. Today she was accompanied by her sister.  She reports family history of anemia.  She was also found to have vitamin B12 deficiency, currently on sublingual B12  MEDICAL HISTORY:  Past Medical History:  Diagnosis Date   Hypertension    Morbid obesity (HCC)    Pre-diabetes     SURGICAL HISTORY: Past Surgical History:  Procedure Laterality Date   BIOPSY N/A 12/01/2022   Procedure: BIOPSY;  Surgeon: Jaynie Collins, DO;  Location: [facility] ENDOSCOPY;  Service: Gastroenterology;  Laterality: N/A;   COLONOSCOPY WITH PROPOFOL N/A 12/01/2022   Procedure: COLONOSCOPY WITH PROPOFOL;  Surgeon: Jaynie Collins, DO;  Location: [facility] ENDOSCOPY;  Service: Gastroenterology;  Laterality: N/A;   ESOPHAGOGASTRODUODENOSCOPY (EGD) WITH PROPOFOL N/A 12/01/2022   Procedure: ESOPHAGOGASTRODUODENOSCOPY (EGD) WITH PROPOFOL;  Surgeon: Jaynie Collins, DO;  Location: [facility] ENDOSCOPY;  Service: Gastroenterology;  Laterality: N/A;    POLYPECTOMY  12/01/2022   Procedure: POLYPECTOMY;  Surgeon: Jaynie Collins, DO;  Location: [facility] ENDOSCOPY;  Service: Gastroenterology;;   TUBAL LIGATION      SOCIAL HISTORY: Social History   Socioeconomic History   Marital status: Single    Spouse name: Not on file   Number of children: Not on file   Years of education: Not on file   Highest education level: Not on file  Occupational History   Not on file  Tobacco Use   Smoking status: Never   Smokeless tobacco: Never  Vaping Use   Vaping status: Never Used  Substance and Sexual Activity   Alcohol use: Yes    Comment: occ   Drug use: Never   Sexual activity: Not on file  Other Topics Concern   Not on file  Social History Narrative   Not on file   Social Determinants of Health   Financial Resource Strain: Low Risk  (04/06/2022)   Received from [facility], [facility]   Overall Financial Resource Strain (CARDIA)    Difficulty of Paying Living Expenses: Not very hard  Food Insecurity: Food Insecurity Present (12/12/2022)   Hunger Vital Sign    Worried About Running Out of Food in the Last Year: Sometimes true    Ran Out of Food in the Last Year: Sometimes true  Transportation Needs: No Transportation Needs (12/12/2022)   PRAPARE - Administrator, Civil Service (Medical): No    Lack of Transportation (Non-Medical): No  Physical Activity: Not on file  Stress: Not on file  Social Connections: Not on file  Intimate Partner Violence: Not At Risk (12/12/2022)   Humiliation, Afraid, Rape, and Kick questionnaire    Fear of Current or Ex-Partner: No    Emotionally Abused: No    Physically Abused: No    Sexually Abused: No    FAMILY HISTORY: Family History  Problem Relation Age of Onset   Anemia Mother    Hypotension Mother    Other Father        vascular alzheimers   Dementia Father    Breast cancer Cousin 28       paternal    ALLERGIES:  has No Known  Allergies.  MEDICATIONS:  Current Outpatient Medications  Medication Sig Dispense Refill   acyclovir (ZOVIRAX) 200 MG capsule Take 200 mg by mouth 5 (five) times daily.     lisinopril-hydrochlorothiazide (ZESTORETIC) 20-25 MG tablet Take 1 tablet by mouth daily.     omeprazole (PRILOSEC) 10 MG capsule Take 20 mg by mouth daily.     No current facility-administered medications for this visit.    Review of Systems  Constitutional:  Negative for appetite change, chills, fatigue and fever.  HENT:   Negative for  hearing loss and voice change.   Eyes:  Negative for eye problems.  Respiratory:  Negative for chest tightness and cough.   Cardiovascular:  Negative for chest pain.  Gastrointestinal:  Negative for abdominal distention, abdominal pain and blood in stool.  Endocrine: Negative for hot flashes.  Genitourinary:  Negative for difficulty urinating and frequency.   Musculoskeletal:  Negative for arthralgias.  Skin:  Negative for itching and rash.  Neurological:  Negative for extremity weakness.  Hematological:  Negative for adenopathy.  Psychiatric/Behavioral:  Negative for confusion.     PHYSICAL EXAMINATION: Vitals:   12/12/22 1502  BP: 138/72  Pulse: 61  Resp: 18  Temp: (!) 97.5 F (36.4 C)   Filed Weights   12/12/22 1502  Weight: (!) 334 lb 3.2 oz (151.6 kg)    Physical Exam Constitutional:      General: She is not in acute distress.    Appearance: She is obese.  HENT:     Head: Normocephalic and atraumatic.  Eyes:     General: No scleral icterus. Cardiovascular:     Rate and Rhythm: Normal rate and regular rhythm.     Heart sounds: Normal heart sounds.  Pulmonary:     Effort: Pulmonary effort is normal. No respiratory distress.     Breath sounds: No wheezing.  Abdominal:     General: Bowel sounds are normal. There is no distension.     Palpations: Abdomen is soft.  Musculoskeletal:        General: No deformity. Normal range of motion.     Cervical back:  Normal range of motion and neck supple.  Skin:    General: Skin is warm and dry.     Findings: No erythema or rash.  Neurological:     Mental Status: She is alert and oriented to person, place, and time. Mental status is at baseline.     Cranial Nerves: No cranial nerve deficit.     Coordination: Coordination normal.  Psychiatric:        Mood and Affect: Mood normal.      LABORATORY DATA:  I have reviewed the data as listed    Latest Ref Rng & Units 12/12/2022    3:36 PM  CBC  WBC 4.0 - 10.5 K/uL 7.9   Hemoglobin 12.0 - 15.0 g/dL 40.3   Hematocrit 47.4 - 46.0 % 36.5   Platelets 150 - 400 K/uL 241        No data to display         Lab Results  Component Value Date   IRON 81 12/12/2022   TIBC 361 12/12/2022   IRONPCTSAT 22 12/12/2022   FERRITIN 69 12/12/2022     RADIOGRAPHIC STUDIES: I have personally reviewed the radiological images as listed and agreed with the findings in the report. No results found.

## 2022-12-12 NOTE — Assessment & Plan Note (Signed)
Check vitamin B 12 and folate

## 2022-12-14 ENCOUNTER — Encounter: Payer: BC Managed Care – PPO | Admitting: Oncology

## 2022-12-14 ENCOUNTER — Other Ambulatory Visit: Payer: BC Managed Care – PPO

## 2022-12-16 ENCOUNTER — Other Ambulatory Visit: Payer: BC Managed Care – PPO

## 2022-12-16 ENCOUNTER — Encounter: Payer: BC Managed Care – PPO | Admitting: Oncology

## 2023-01-02 ENCOUNTER — Inpatient Hospital Stay: Payer: BC Managed Care – PPO | Attending: Oncology | Admitting: Oncology

## 2023-01-02 NOTE — Assessment & Plan Note (Deleted)
 Chronic microcytosis, previous normal iron panel.  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 Recommend to repeat CBC, check smear, iron TIBC ferritin, hemoglobinopathy evaluation

## 2023-04-17 ENCOUNTER — Other Ambulatory Visit: Payer: Self-pay | Admitting: Family Medicine

## 2023-04-17 DIAGNOSIS — Z1231 Encounter for screening mammogram for malignant neoplasm of breast: Secondary | ICD-10-CM

## 2023-09-30 IMAGING — MG MM DIGITAL DIAGNOSTIC UNILAT*R* W/ TOMO W/ CAD
8 series · 8 of 24 positions shown · non-contrast
Comparison: Previous exam(s).

CLINICAL DATA: Trauma to the RIGHT breast.  Palpable painful areas.

EXAM:
DIGITAL DIAGNOSTIC UNILATERAL RIGHT MAMMOGRAM WITH TOMOSYNTHESIS AND
CAD; ULTRASOUND RIGHT BREAST LIMITED
TECHNIQUE: Right digital diagnostic mammography and breast tomosynthesis was
performed. The images were evaluated with computer-aided detection.;
Targeted ultrasound examination of the right breast was performed

[R CC synth-2D (1 of 2)]
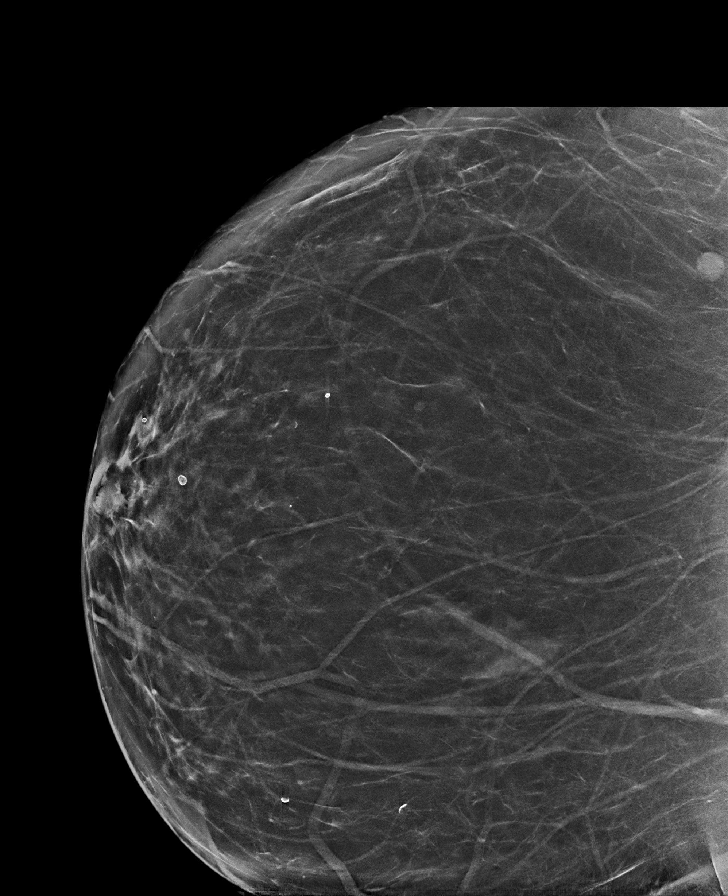

[R CC synth-2D (2 of 2)]
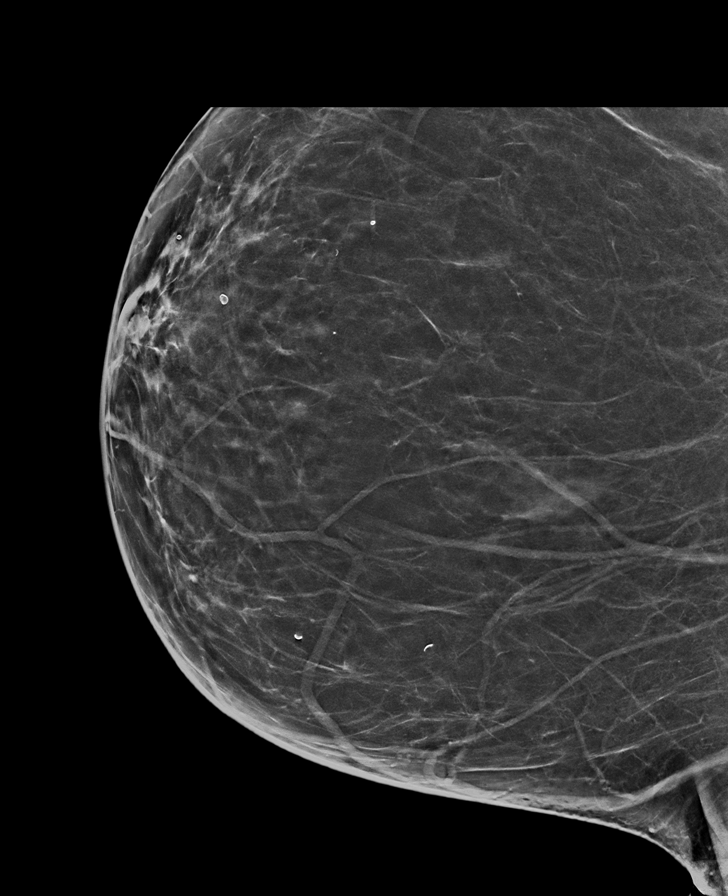

[R MLO synth-2D (1 of 2)]
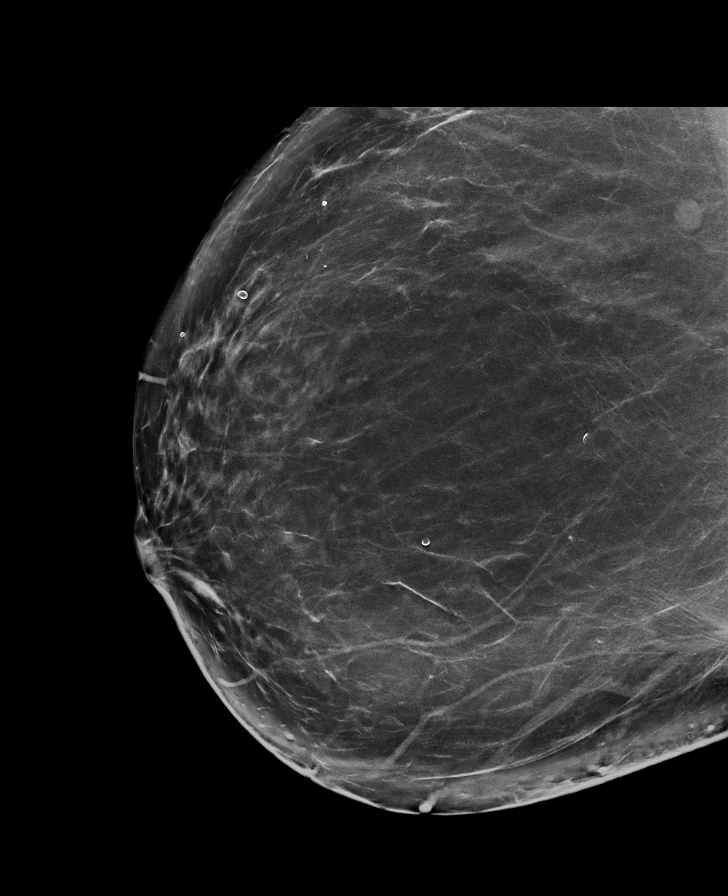

[R MLO synth-2D (2 of 2)]
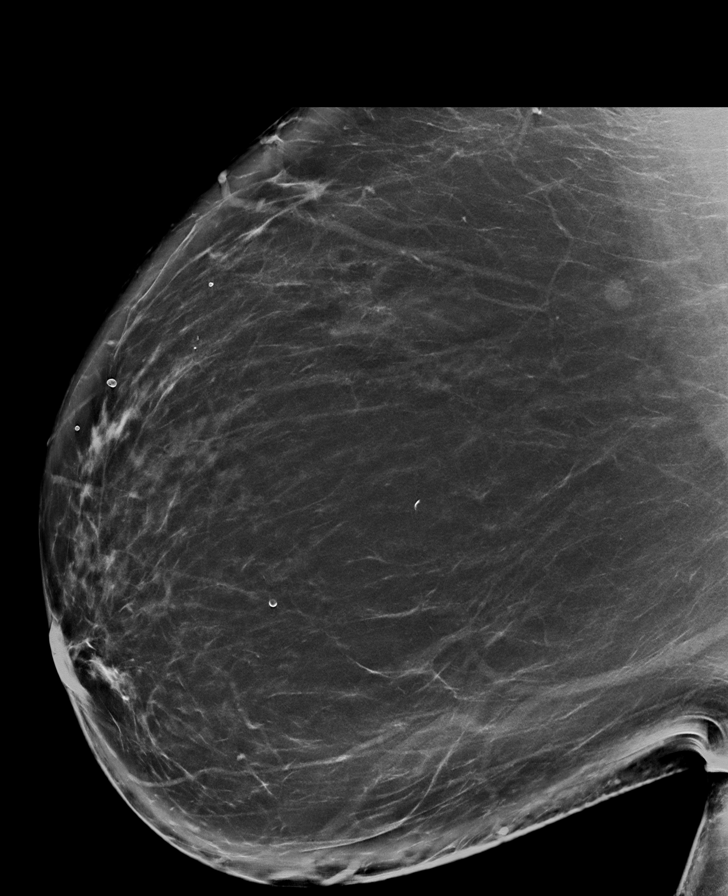

[R MLO tomo (1 of 2) · tomo slice 53/106.0]
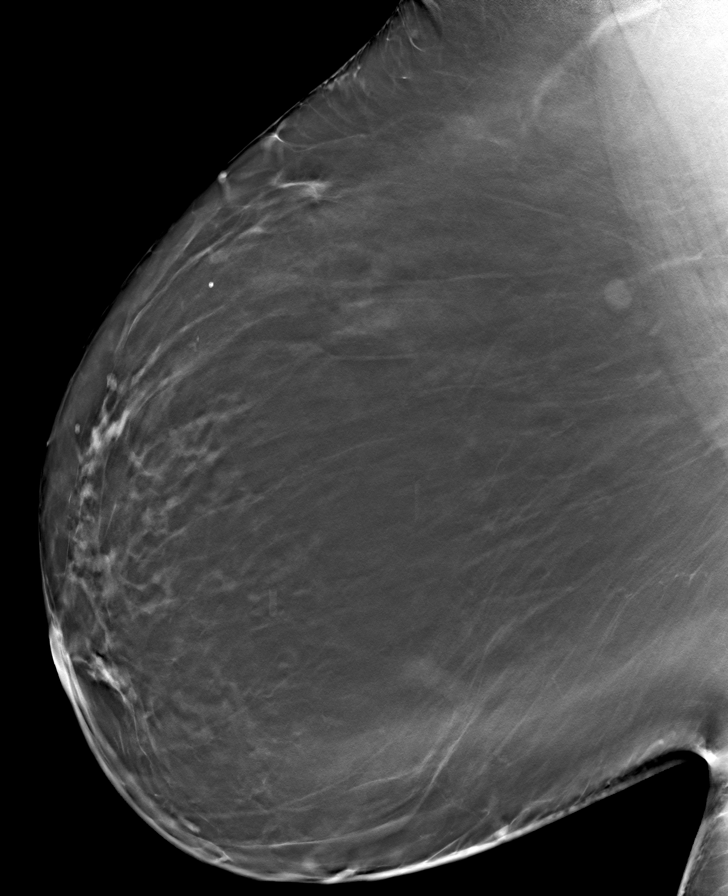

[R CC tomo (1 of 2) · tomo slice 49/97.0]
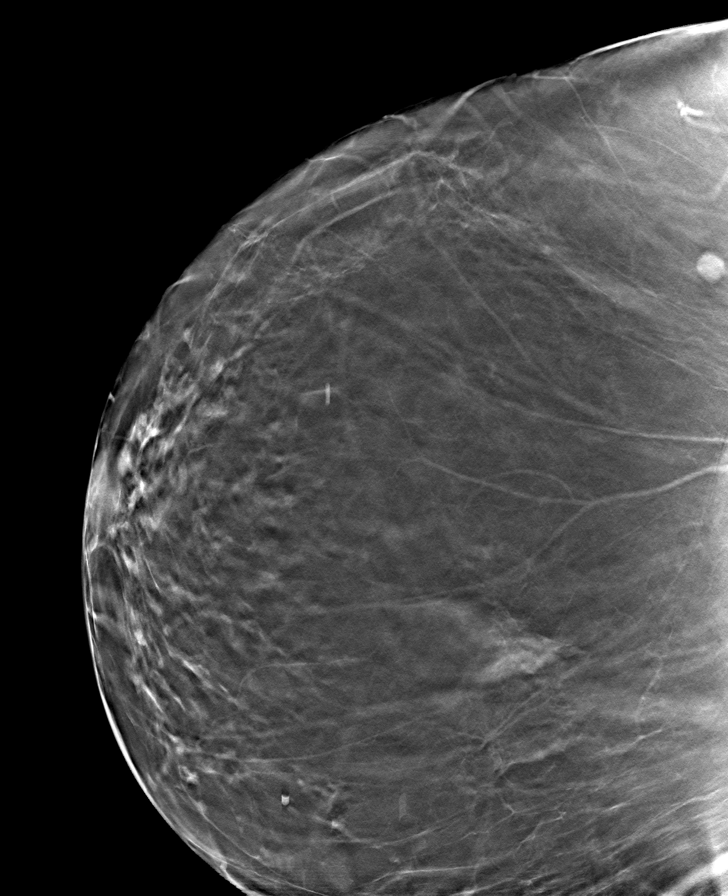

[R CC tomo (2 of 2) · tomo slice 45/88.0]
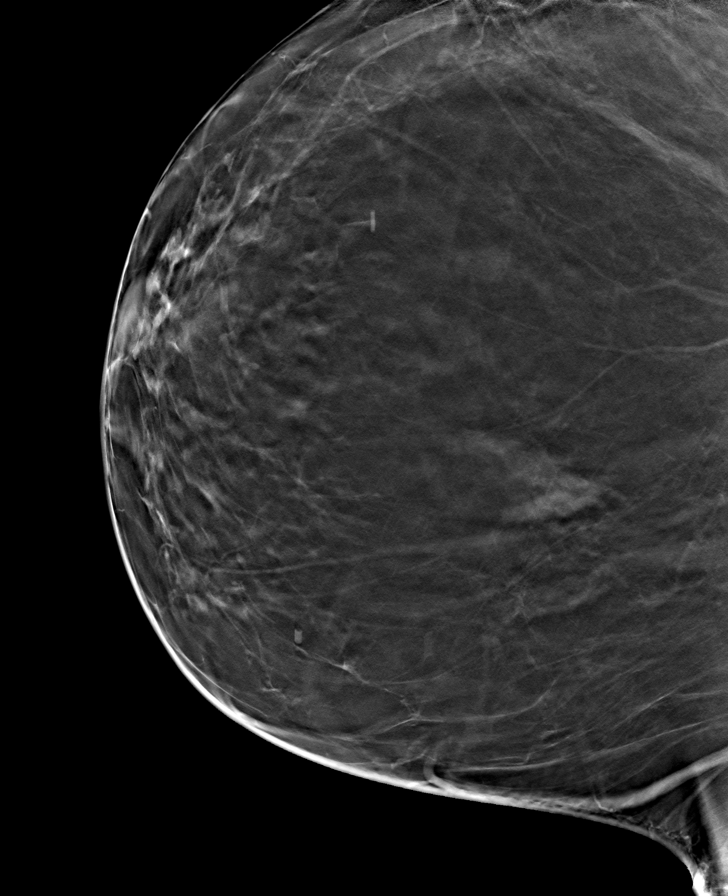

[R MLO tomo (2 of 2) · tomo slice 53/104.0]
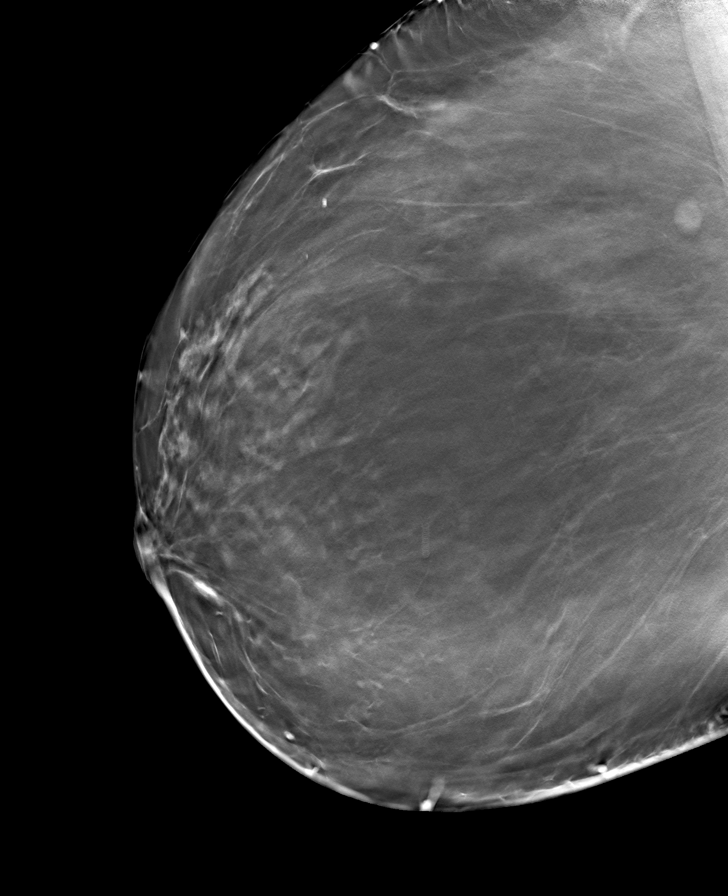

[8 of 24 positions shown; findings below may reference images not displayed]

ACR Breast Density Category b: There are scattered areas of
fibroglandular density.
FINDINGS: Diagnostic mammographic images were obtained over the area of
painful/palpable concern in the RIGHT breast. No suspicious
mammographic finding is identified in this area. No suspicious mass,
microcalcification, or other finding is identified in the RIGHT
breast. No suspicious mammographic etiology for breast pain
identified.

On physical exam, no suspicious mass is appreciated. There is
superficial soft lumpiness in the far upper breast.

Targeted ultrasound was performed of the sites of most painful and
palpable concern. No suspicious cystic or solid mass is seen. Patent
veins are noted in a palpable area at 1 o'clock 16 cm from the
nipple. In the far upper breast at a site of palpable concern at 12
o'clock, there is superficial echogenic fat with scattered internal
anechoic spaces consistent with bruising/fat necrosis.
IMPRESSION: 1. No mammographic or sonographic evidence of malignancy at the
sites of most painful/palpable concern in the RIGHT breast. There
are scattered areas of bruising and benign fat necrosis noted. Any
further workup of the patient's symptoms should be based on the
clinical assessment. Recommend routine annual screening mammogram,
due September 2021.
2. No mammographic evidence of malignancy in the RIGHT breast.

RECOMMENDATION:
Recommend return to annual screening mammography, due September 2021

I have discussed the findings and recommendations with the patient.
If applicable, a reminder letter will be sent to the patient
regarding the next appointment.

BI-RADS CATEGORY  2: Benign.

## 2023-11-10 ENCOUNTER — Other Ambulatory Visit: Payer: Self-pay | Admitting: Nurse Practitioner

## 2023-11-10 DIAGNOSIS — Z1231 Encounter for screening mammogram for malignant neoplasm of breast: Secondary | ICD-10-CM

## 2023-11-28 ENCOUNTER — Ambulatory Visit
Admission: RE | Admit: 2023-11-28 | Discharge: 2023-11-28 | Disposition: A | Discharge status: Home / Self Care | Payer: Self-pay | Source: Ambulatory Visit | Attending: Nurse Practitioner | Admitting: Nurse Practitioner

## 2023-11-28 DIAGNOSIS — Z1231 Encounter for screening mammogram for malignant neoplasm of breast: Secondary | ICD-10-CM | POA: Insufficient documentation

## 2024-02-22 ENCOUNTER — Other Ambulatory Visit: Payer: Self-pay | Admitting: Nephrology

## 2024-02-22 DIAGNOSIS — N1832 Chronic kidney disease, stage 3b: Secondary | ICD-10-CM

## 2024-02-22 DIAGNOSIS — I10 Essential (primary) hypertension: Secondary | ICD-10-CM

## 2024-02-29 ENCOUNTER — Ambulatory Visit
Admission: RE | Admit: 2024-02-29 | Discharge: 2024-02-29 | Disposition: A | Discharge status: Home / Self Care | Source: Ambulatory Visit | Attending: Nephrology | Admitting: Nephrology

## 2024-02-29 DIAGNOSIS — N1832 Chronic kidney disease, stage 3b: Secondary | ICD-10-CM | POA: Insufficient documentation

## 2024-02-29 DIAGNOSIS — I10 Essential (primary) hypertension: Secondary | ICD-10-CM | POA: Insufficient documentation

## 2024-03-01 IMAGING — US US EXTREM LOW VENOUS*R*
1 series · 13 of 24 positions shown · non-contrast
Comparison: None Available.

CLINICAL DATA: Right lower extremity pain and edema. Evaluate for
DVT.



[Series 1: us venous img lower uni right (dvt) · portal-venous · 13 of 32 slices shown]
[im 1/32]
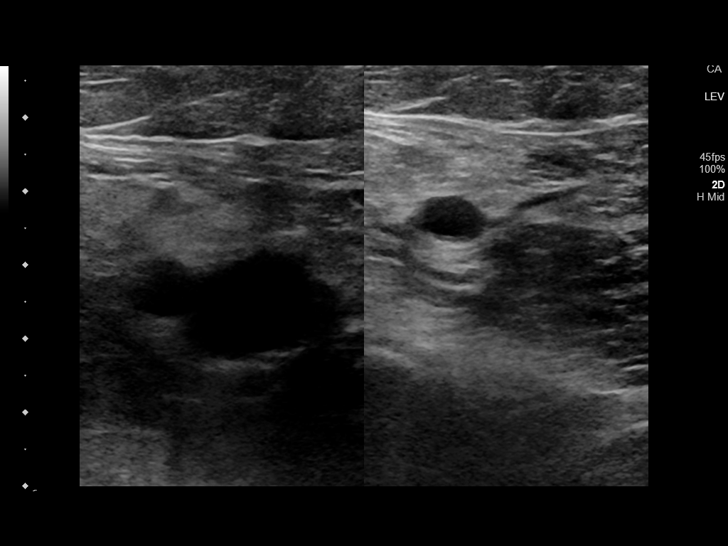
[im 3/32]
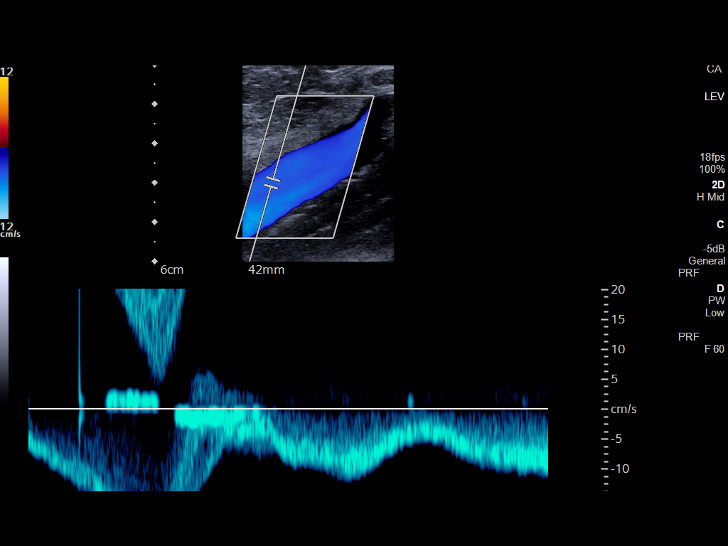
[im 6/32]
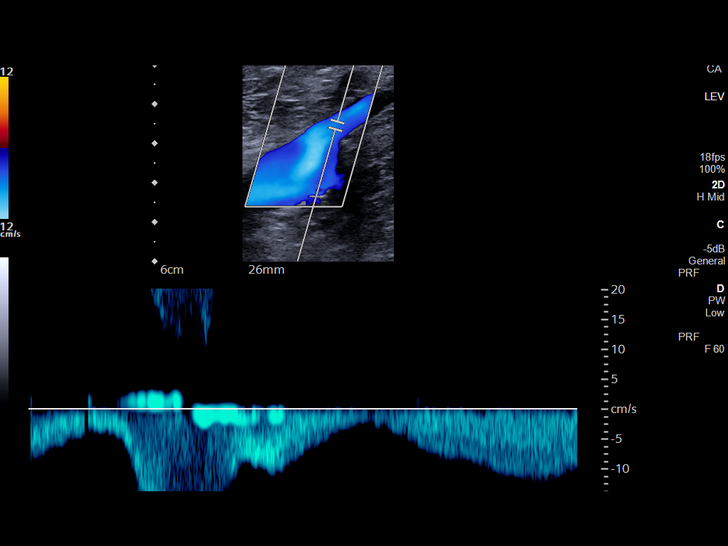
[im 9/32]
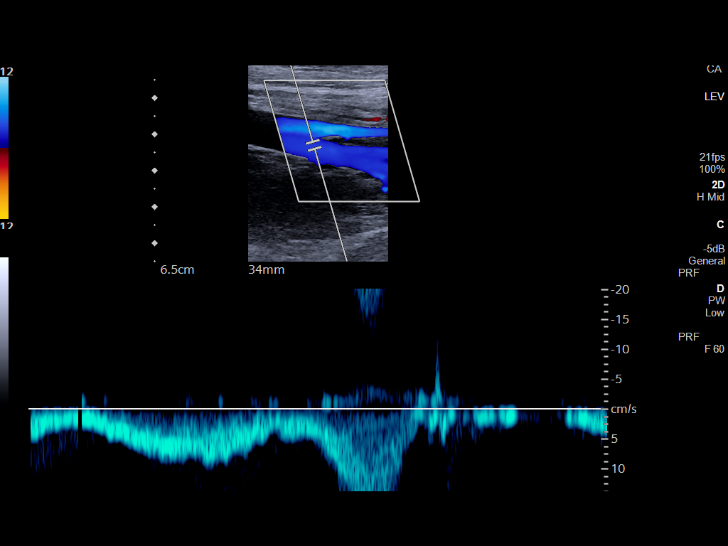
[im 11/32]
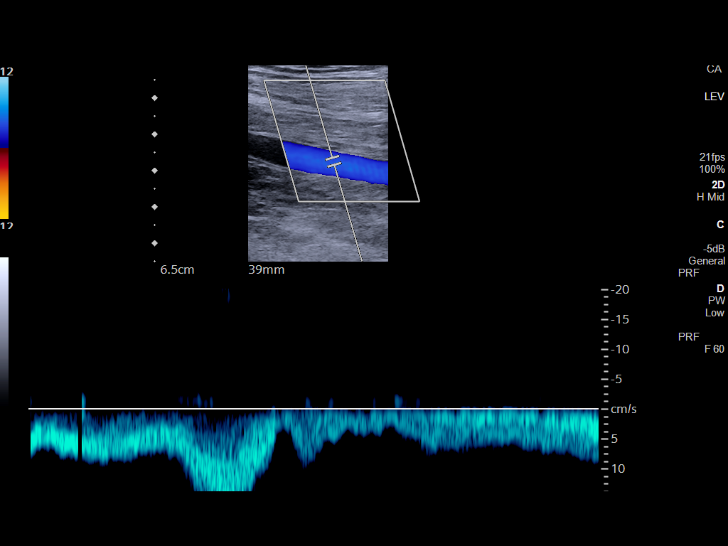
[im 14/32]
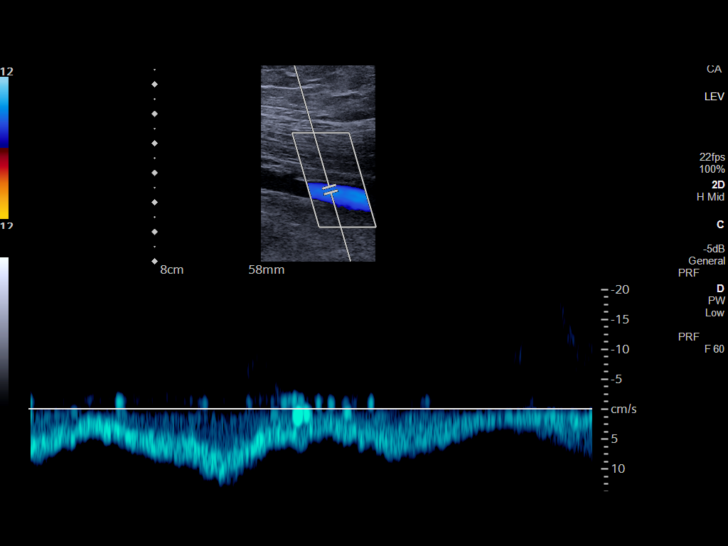
[im 17/32]
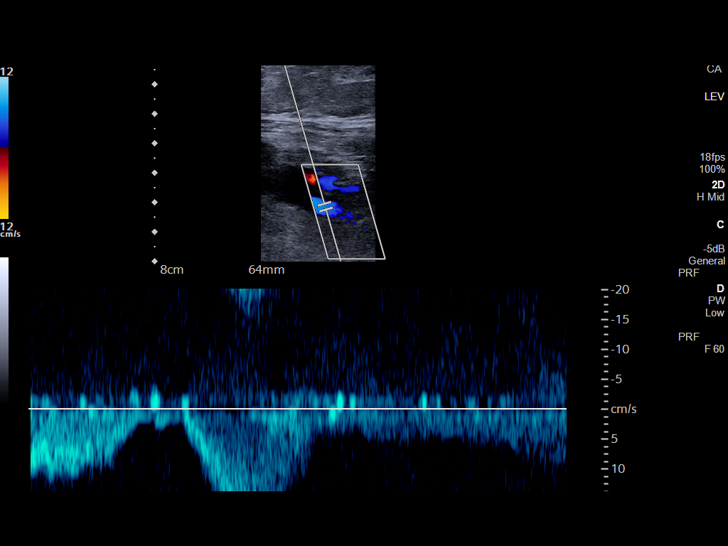
[im 18/32]
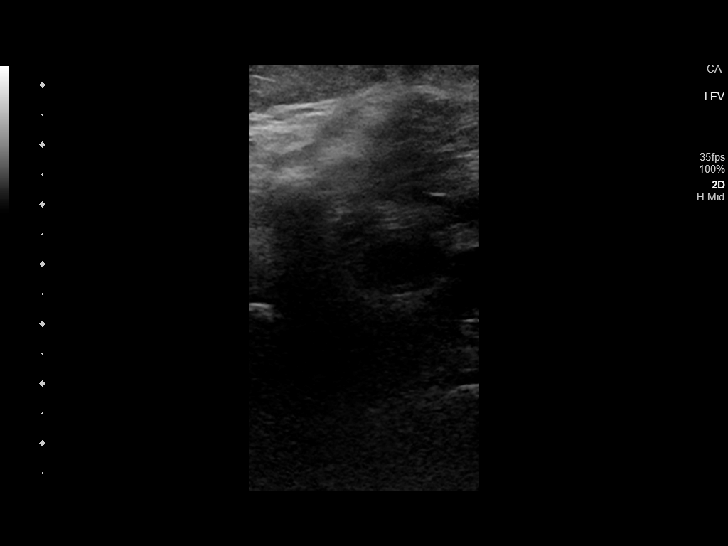
[im 21/32]
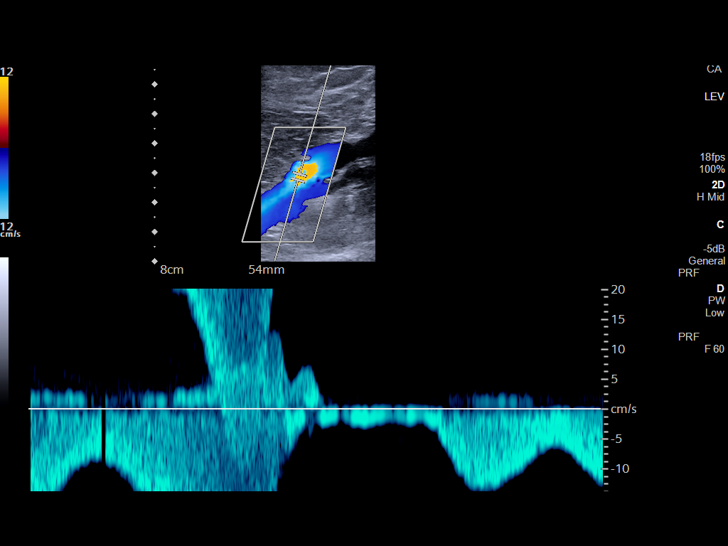
[im 23/32]
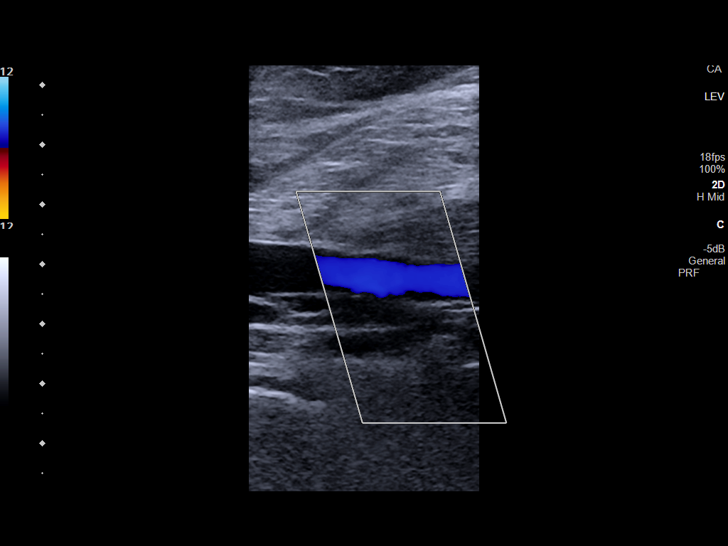
[im 26/32]
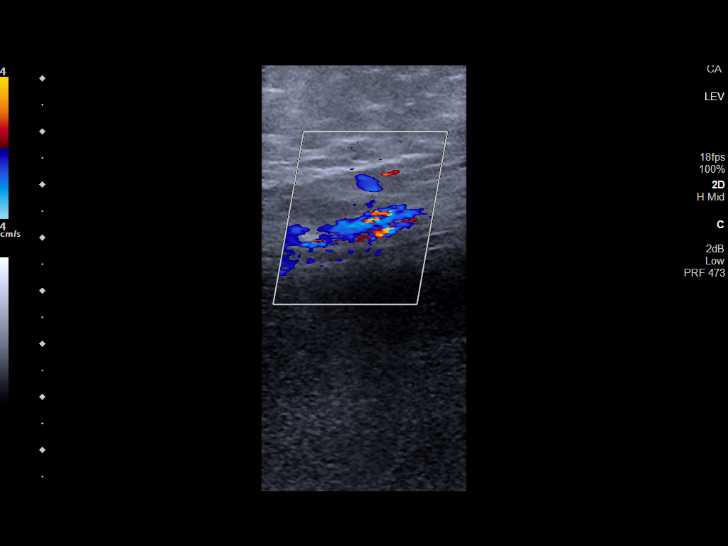
[im 29/32]
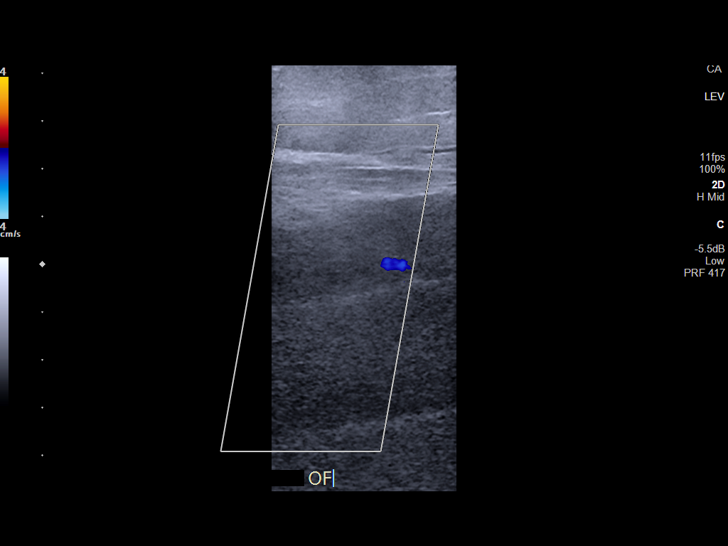
[im 32/32]
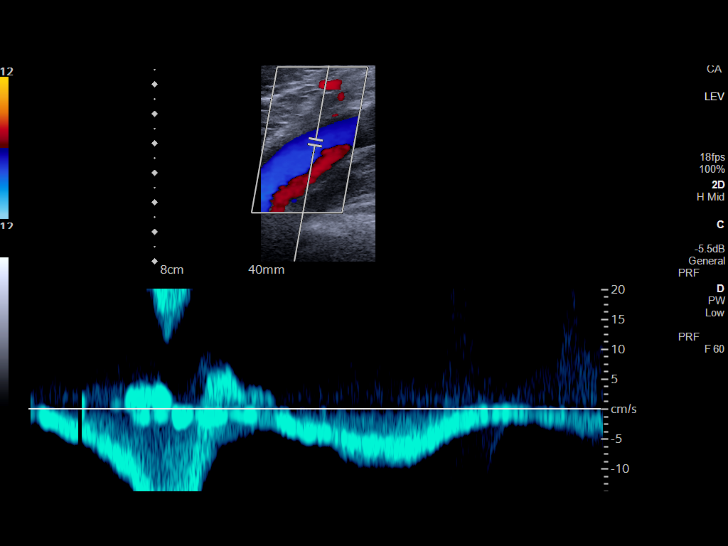

[13 of 24 positions shown; findings below may reference images not displayed]

FINDINGS: Contralateral Common Femoral Vein: Respiratory phasicity is normal
and symmetric with the symptomatic side. No evidence of thrombus.
Normal compressibility.

Common Femoral Vein: No evidence of thrombus. Normal
compressibility, respiratory phasicity and response to augmentation.

Saphenofemoral Junction: No evidence of thrombus. Normal
compressibility and flow on color Doppler imaging.

Profunda Femoral Vein: No evidence of thrombus. Normal
compressibility and flow on color Doppler imaging.

Femoral Vein: No evidence of thrombus. Normal compressibility,
respiratory phasicity and response to augmentation.

Popliteal Vein: No evidence of thrombus. Normal compressibility,
respiratory phasicity and response to augmentation.

Calf Veins: Appear patent where visualized.

Superficial Great Saphenous Vein: No evidence of thrombus. Normal
compressibility.

Other Findings:  None.
IMPRESSION: No evidence of DVT within the right lower extremity.
